# Patient Record
Sex: Male | Born: 1970 | Race: Black or African American | Hispanic: No | Marital: Single | Smoking: Never smoker
Health system: Southern US, Community
[De-identification: ages and names within clinical notes are randomized; demographics above are authoritative.]

## PROBLEM LIST (undated history)

## (undated) DIAGNOSIS — I10 Essential (primary) hypertension: Secondary | ICD-10-CM

---

## 2009-12-18 ENCOUNTER — Emergency Department (HOSPITAL_COMMUNITY): Admission: EM | Admit: 2009-12-18 | Discharge: 2009-12-18 | Payer: Self-pay | Admitting: Emergency Medicine

## 2010-05-23 LAB — URINE MICROSCOPIC-ADD ON

## 2010-05-23 LAB — COMPREHENSIVE METABOLIC PANEL
AST: 22 U/L (ref 0–37)
BUN: 9 mg/dL (ref 6–23)
CO2: 28 mEq/L (ref 19–32)
Calcium: 8.8 mg/dL (ref 8.4–10.5)
Chloride: 104 mEq/L (ref 96–112)
Creatinine, Ser: 1.12 mg/dL (ref 0.4–1.5)
GFR calc Af Amer: >60 mL/min (ref >60)
GFR calc non Af Amer: >60 mL/min (ref >60)
Glucose, Bld: 90 mg/dL (ref 70–99)
Total Bilirubin: 0.5 mg/dL (ref 0.3–1.2)

## 2010-05-23 LAB — LIPASE, BLOOD: Lipase: 26 U/L (ref 11–59)

## 2010-05-23 LAB — CBC
Hemoglobin: 14.5 g/dL (ref 13.0–17.0)
MCH: 28.8 pg (ref 26.0–34.0)
MCHC: 32.8 g/dL (ref 30.0–36.0)
MCV: 87.7 fL (ref 78.0–100.0)

## 2010-05-23 LAB — URINALYSIS, ROUTINE W REFLEX MICROSCOPIC
Ketones, ur: NEGATIVE mg/dL
Nitrite: NEGATIVE
Specific Gravity, Urine: 1.022 (ref 1.005–1.030)
Urobilinogen, UA: 0.2 mg/dL (ref 0.0–1.0)
pH: 6.5 (ref 5.0–8.0)

## 2010-05-23 LAB — DIFFERENTIAL
Basophils Absolute: 0 10*3/uL (ref 0.0–0.1)
Lymphocytes Relative: 38 % (ref 12–46)
Lymphs Abs: 2.2 10*3/uL (ref 0.7–4.0)
Neutro Abs: 3 10*3/uL (ref 1.7–7.7)
Neutrophils Relative %: 52 % (ref 43–77)

## 2011-07-25 ENCOUNTER — Emergency Department (HOSPITAL_COMMUNITY): Payer: Self-pay

## 2011-07-25 ENCOUNTER — Encounter (HOSPITAL_COMMUNITY): Payer: Self-pay | Admitting: *Deleted

## 2011-07-25 ENCOUNTER — Emergency Department (HOSPITAL_COMMUNITY)
Admission: EM | Admit: 2011-07-25 | Discharge: 2011-07-25 | Disposition: A | Discharge status: Home / Self Care | Payer: Self-pay | Attending: Emergency Medicine | Admitting: Emergency Medicine

## 2011-07-25 DIAGNOSIS — I459 Conduction disorder, unspecified: Secondary | ICD-10-CM | POA: Insufficient documentation

## 2011-07-25 DIAGNOSIS — R51 Headache: Secondary | ICD-10-CM | POA: Insufficient documentation

## 2011-07-25 DIAGNOSIS — R55 Syncope and collapse: Secondary | ICD-10-CM | POA: Insufficient documentation

## 2011-07-25 DIAGNOSIS — I10 Essential (primary) hypertension: Secondary | ICD-10-CM

## 2011-07-25 LAB — COMPREHENSIVE METABOLIC PANEL
ALT: 26 U/L (ref 0–53)
Alkaline Phosphatase: 76 U/L (ref 39–117)
BUN: 12 mg/dL (ref 6–23)
Chloride: 103 mEq/L (ref 96–112)
GFR calc Af Amer: 81 mL/min — ABNORMAL LOW (ref >90)
Glucose, Bld: 103 mg/dL — ABNORMAL HIGH (ref 70–99)
Potassium: 4 mEq/L (ref 3.5–5.1)
Sodium: 140 mEq/L (ref 135–145)
Total Bilirubin: 0.5 mg/dL (ref 0.3–1.2)
Total Protein: 8 g/dL (ref 6.0–8.3)

## 2011-07-25 LAB — CBC
HCT: 46.7 % (ref 39.0–52.0)
Hemoglobin: 15.4 g/dL (ref 13.0–17.0)
MCHC: 33 g/dL (ref 30.0–36.0)
RBC: 5.4 MIL/uL (ref 4.22–5.81)
WBC: 6.8 10*3/uL (ref 4.0–10.5)

## 2011-07-25 LAB — TROPONIN I: Troponin I: <0.3 ng/mL (ref <0.30)

## 2011-07-25 NOTE — ED Notes (Signed)
ZOX:[id number]<AV> Expected date:<BR> Expected time: 1:23 PM<BR> Means of arrival:<BR> Comments:<BR> M61 - 40yoM Syncope [place] bus stop

## 2011-07-25 NOTE — Discharge Instructions (Signed)
Rest. Drink plenty of fluids. Follow up with primary care doctor in the next few days for recheck - also have your blood pressure rechecked then, as it is high today. Return to ER if worse, symptoms recur, faint/dizzy, palpitations or persistent fast heart beat, chest pain, shortness of breath, fevers, other concern.     Near-Syncope Near-syncope is sudden weakness, dizziness, or feeling like you might pass out (faint). This may occur when getting up after sitting or while standing for a long period of time. Near-syncope can be caused by a drop in blood pressure. This is a common reaction, but it may occur to a greater degree in people taking medicines to control their blood pressure. Fainting often occurs when the blood pressure or pulse is too low to provide enough blood flow to the brain to keep you conscious. Fainting and near-syncope are not usually due to serious medical problems. However, certain people should be more cautious in the event of near-syncope, including elderly patients, patients with diabetes, and patients with a history of heart conditions (especially irregular rhythms).  CAUSES   Drop in blood pressure.   Physical pain.   Dehydration.   Heat exhaustion.   Emotional distress.   Low blood sugar.   Internal bleeding.   Heart and circulatory problems.   Infections.  SYMPTOMS   Dizziness.   Feeling sick to your stomach (nauseous).   Nearly fainting.   Body numbness.   Turning pale.   Tunnel vision.   Weakness.  HOME CARE INSTRUCTIONS   Lie down right away if you start feeling like you might faint. Breathe deeply and steadily. Wait until all the symptoms have passed. Most of these episodes last only a few minutes. You may feel tired for several hours.   Drink enough fluids to keep your urine clear or pale yellow.   If you are taking blood pressure or heart medicine, get up slowly, taking several minutes to sit and then stand. This can reduce dizziness  that is caused by a drop in blood pressure.  SEEK IMMEDIATE MEDICAL CARE IF:   You have a severe headache.   Unusual pain develops in the chest, abdomen, or back.   There is bleeding from the mouth or rectum, or you have black or tarry stool.   An irregular heartbeat or a very rapid pulse develops.   You have repeated fainting or seizure-like jerking during an episode.   You faint when sitting or lying down.   You develop confusion.   You have difficulty walking.   Severe weakness develops.   Vision problems develop.  MAKE SURE YOU:   Understand these instructions.   Will watch your condition.   Will get help right away if you are not doing well or get worse.  Document Released: 02/24/2005 Document Revised: 02/13/2011 Document Reviewed: 04/12/2010 [facility] Patient Information 2012 [place], [place].      Syncope You have had a fainting (syncopal) spell. A fainting episode is a sudden, short-lived loss of consciousness. It results in complete recovery. It occurs because there has been a temporary shortage of oxygen and/or sugar (glucose) to the brain. CAUSES   Blood pressure pills and other medications that may lower blood pressure below normal. Sudden changes in posture (sudden standing).   Over-medication. Take your medications as directed.   Standing too long. This can cause blood to pool in the legs.   Seizure disorders.   Low blood sugar (hypoglycemia) of diabetes. This more commonly causes coma.  Bearing down to go to the bathroom. This can cause your blood pressure to rise suddenly. Your body compensates by making the blood pressure too low when you stop bearing down.   Hardening of the arteries where the brain temporarily does not receive enough blood.   Irregular heart beat and circulatory problems.   Fear, emotional distress, injury, sight of blood, or illness.  Your caregiver will send you home if the syncope was from non-worrisome causes (benign).  Depending on your age and health, you may stay to be monitored and observed. If you return home, have someone stay with you if your caregiver feels that is desirable. It is very important to keep all follow-up referrals and appointments in order to properly manage this condition. This is a serious problem which can lead to serious illness and death if not carefully managed.  WARNING: Do not drive or operate machinery until your caregiver feels that it is safe for you to do so. SEEK IMMEDIATE MEDICAL CARE IF:   You have another fainting episode or faint while lying or sitting down. DO NOT DRIVE YOURSELF. Call 911 if no other help is available.   You have chest pain, are feeling sick to your stomach (nausea), vomiting or abdominal pain.   You have an irregular heartbeat or one that is very fast (pulse over 120 beats per minute).   You have a loss of feeling in some part of your body or lose movement in your arms or legs.   You have difficulty with speech, confusion, severe weakness, or visual problems.   You become sweaty and/or feel light headed.  Make sure you are rechecked as instructed. Document Released: 02/24/2005 Document Revised: 02/13/2011 Document Reviewed: 10/15/2006 [facility] Patient Information 2012 [place], [place].       Hypertension As your heart beats, it forces blood through your arteries. This force is your blood pressure. If the pressure is too high, it is called hypertension (HTN) or high blood pressure. HTN is dangerous because you may have it and not know it. High blood pressure may mean that your heart has to work harder to pump blood. Your arteries may be narrow or stiff. The extra work puts you at risk for heart disease, stroke, and other problems.  Blood pressure consists of two numbers, a higher number over a lower, 110/72, for example. It is stated as "110 over 72." The ideal is below 120 for the top number (systolic) and under 80 for the bottom (diastolic). Write  down your blood pressure today. You should pay close attention to your blood pressure if you have certain conditions such as:  Heart failure.   Prior heart attack.   Diabetes   Chronic kidney disease.   Prior stroke.   Multiple risk factors for heart disease.  To see if you have HTN, your blood pressure should be measured while you are seated with your arm held at the level of the heart. It should be measured at least twice. A one-time elevated blood pressure reading (especially in the Emergency Department) does not mean that you need treatment. There may be conditions in which the blood pressure is different between your right and left arms. It is important to see your caregiver soon for a recheck. Most people have essential hypertension which means that there is not a specific cause. This type of high blood pressure may be lowered by changing lifestyle factors such as:  Stress.   Smoking.   Lack of exercise.   Excessive  weight.   Drug/tobacco/alcohol use.   Eating less salt.  Most people do not have symptoms from high blood pressure until it has caused damage to the body. Effective treatment can often prevent, delay or reduce that damage. TREATMENT  When a cause has been identified, treatment for high blood pressure is directed at the cause. There are a large number of medications to treat HTN. These fall into several categories, and your caregiver will help you select the medicines that are best for you. Medications may have side effects. You should review side effects with your caregiver. If your blood pressure stays high after you have made lifestyle changes or started on medicines,   Your medication(s) may need to be changed.   Other problems may need to be addressed.   Be certain you understand your prescriptions, and know how and when to take your medicine.   Be sure to follow up with your caregiver within the time frame advised (usually within two weeks) to have your  blood pressure rechecked and to review your medications.   If you are taking more than one medicine to lower your blood pressure, make sure you know how and at what times they should be taken. Taking two medicines at the same time can result in blood pressure that is too low.  SEEK IMMEDIATE MEDICAL CARE IF:  You develop a severe headache, blurred or changing vision, or confusion.   You have unusual weakness or numbness, or a faint feeling.   You have severe chest or abdominal pain, vomiting, or breathing problems.  MAKE SURE YOU:   Understand these instructions.   Will watch your condition.   Will get help right away if you are not doing well or get worse.  Document Released: 02/24/2005 Document Revised: 02/13/2011 Document Reviewed: 10/15/2007 [facility] Patient Information 2012 [place], [place].         RESOURCE GUIDE  Dental Problems  Patients with Medicaid: [facility] [phone]                                           [phone] [address] Phone:  [phone]                                                  Phone:  [phone]  If unable to pay or uninsured, contact:  [facility] or [facility]. to become qualified for the adult dental clinic.  Chronic Pain Problems Contact [facility]  [phone] Patients need to be referred by their primary care doctor.  Insufficient Money for Medicine Contact [employer]:  call "211" or [facility] [phone].  No Primary Care Doctor Call [facility]  2260551002 Other agencies that provide inexpensive medical care    [facility]  [phone]    [facility]  [phone]    [facility]  [phone]    [facility]  [phone]    [facility]  [phone]    [facility]  [phone]  Psychological Services [facility]  [phone] [facility]  [phone] [facility]  Mental Health   [phone] (emergency services [phone])  Substance Abuse Resources [facility]  [phone] [facility] [phone] [facility] [phone] Floydene Flock [phone] Residential & Outpatient Substance Abuse Program  [phone]  Abuse/Neglect [facility] Child Abuse Hotline (936)615-1412 [facility] Child Abuse Hotline [phone] (After Hours)  Emergency Shelter [facility] 3183782928  Maternity [facility] [place] [facility] [phone] [facility] [phone]  MRSA Hotline #:   812-349-3269    [facility] Emergency Dept Resources  [facility]     [employer]                          [facility] Dept. [address] [place]                       [address]      [address]  Blondell Reveal Phone:  [phone]                                   Phone:  [phone]                 Phone:  [phone]  [facility] Phone:  [phone]  [facility] Child Abuse Hotline (918)504-4617 [phone] (After Hours)

## 2011-07-25 NOTE — ED Provider Notes (Addendum)
History     CSN: [id number]  Arrival date & time 07/25/11  1327   First MD Initiated Contact with Patient 07/25/11 1458      Chief Complaint  Patient presents with  . Near Syncope    (Consider location/radiation/quality/duration/timing/severity/associated sxs/prior treatment) The history is provided by the patient.  pt c/o feeling very faint, almost passing out just prior to arrival. States had walked 'in heat' to bus stop. hadnt eaten anything today. Denies any chest pain or tightness. No palpitations or irregular heart beat. No hx dysrhythmia. Denies hx cad. No fam hx premature cad. States felt find when awoke today. Denies loc or injury. States now has gradual onset frontal headache, c/w prior headaches. No headache earlier or associated with feeling faint earlier. No neck pain or stiffness. No change in speech or vision. No numbness/weakness. Denies problems w balance or coordination. Pt denies cough, sinus pain/drainage or uri c/o. No sob. No abd pain. No nvd. No melena or rectal bleeding. Denies hx anemia. No gu c/o. No fever or chills. Denies recent change in meds or new meds. No substance abuse.   History reviewed. No pertinent past medical history.  History reviewed. No pertinent past surgical history.  History reviewed. No pertinent family history.  History  Substance Use Topics  . Smoking status: Not on file  . Smokeless tobacco: Not on file  . Alcohol Use: Not on file      Review of Systems  Constitutional: Negative for fever and chills.  HENT: Negative for sore throat and neck pain.   Eyes: Negative for pain and visual disturbance.  Respiratory: Negative for cough and shortness of breath.   Cardiovascular: Negative for chest pain, palpitations and leg swelling.  Gastrointestinal: Negative for vomiting, abdominal pain, diarrhea and blood in stool.  Genitourinary: Negative for dysuria and flank pain.  Musculoskeletal: Negative for back pain.  Skin: Negative for  rash.  Neurological: Negative for weakness and numbness.  Hematological: Does not bruise/bleed easily.  Psychiatric/Behavioral: Negative for confusion.    Allergies  Review of patient's allergies indicates no known allergies.  Home Medications   Current Outpatient Rx  Name Route Sig Dispense Refill  . NAPROXEN SODIUM 220 MG PO TABS Oral Take 220 mg by mouth 2 (two) times daily as needed. For pain.      BP 157/97  Pulse 93  Temp(Src) 97.8 F (36.6 C) (Oral)  Resp 20  SpO2 99%  Physical Exam  Nursing note and vitals reviewed. Constitutional: He is oriented to person, place, and time. He appears well-developed and well-nourished. No distress.  HENT:  Head: Atraumatic.  Nose: Nose normal.  Mouth/Throat: Oropharynx is clear and moist.  Eyes: Conjunctivae and EOM are normal. Pupils are equal, round, and reactive to light. No scleral icterus.  Neck: Normal range of motion. Neck supple. No tracheal deviation present.       No stiffness or rigidity.   Cardiovascular: Normal rate, regular rhythm, normal heart sounds and intact distal pulses.  Exam reveals no gallop and no friction rub.   No murmur heard. Pulmonary/Chest: Effort normal and breath sounds normal. No accessory muscle usage. No respiratory distress.  Abdominal: Soft. Bowel sounds are normal. He exhibits no distension and no mass. There is no tenderness. There is no rebound and no guarding.  Genitourinary:       No cva tenderness  Musculoskeletal: Normal range of motion. He exhibits no edema and no tenderness.  Neurological: He is alert and oriented to person, place, and  time. No cranial nerve deficit.       Motor intact bil. Steady gait.   Skin: Skin is warm and dry. He is not diaphoretic.  Psychiatric: He has a normal mood and affect.    ED Course  Procedures (including critical care time)   Labs Reviewed  CBC  COMPREHENSIVE METABOLIC PANEL  TROPONIN I   Results for orders placed during the hospital encounter  of 07/25/11  CBC      Component Value Range   WBC 6.8  4.0 - 10.5 (K/uL)   RBC 5.40  4.22 - 5.81 (MIL/uL)   Hemoglobin 15.4  13.0 - 17.0 (g/dL)   HCT 45.4  09.8 - 11.9 (%)   MCV 86.5  78.0 - 100.0 (fL)   MCH 28.5  26.0 - 34.0 (pg)   MCHC 33.0  30.0 - 36.0 (g/dL)   RDW 14.7  82.9 - 56.2 (%)   Platelets 288  150 - 400 (K/uL)  COMPREHENSIVE METABOLIC PANEL      Component Value Range   Sodium 140  135 - 145 (mEq/L)   Potassium 4.0  3.5 - 5.1 (mEq/L)   Chloride 103  96 - 112 (mEq/L)   CO2 28  19 - 32 (mEq/L)   Glucose, Bld 103 (*) 70 - 99 (mg/dL)   BUN 12  6 - 23 (mg/dL)   Creatinine, Ser 1.30  0.50 - 1.35 (mg/dL)   Calcium 8.8  8.4 - 86.5 (mg/dL)   Total Protein 8.0  6.0 - 8.3 (g/dL)   Albumin 4.2  3.5 - 5.2 (g/dL)   AST 34  0 - 37 (U/L)   ALT 26  0 - 53 (U/L)   Alkaline Phosphatase 76  39 - 117 (U/L)   Total Bilirubin 0.5  0.3 - 1.2 (mg/dL)   GFR calc non Af Amer 70 (*) >90 (mL/min)   GFR calc Af Amer 81 (*) >90 (mL/min)  TROPONIN I      Component Value Range   Troponin I <0.30  <0.30 (ng/mL)   Ct Head Wo Contrast  07/25/2011  *RADIOLOGY REPORT*  Clinical Data: History of syncope.  Headache.  CT HEAD WITHOUT CONTRAST  Technique:  Contiguous axial images were obtained from the base of the skull through the vertex without contrast.  Comparison: No priors.  Findings: No acute intracranial abnormalities.  Specifically, no definitive signs to suggest acute/subacute cerebral ischemia, no hemorrhage, mass, mass effect, hydrocephalus or abnormal intra or extra-axial fluid collections.  No acute displaced skull fractures are identified.  Visualized paranasal sinuses and mastoids are well pneumatized.  IMPRESSION: 1.  No acute intracranial abnormalities. 2.  The appearance of the brain is normal.  Original Report Authenticated By: Florencia Reasons, M.D.       MDM  Monitor. Ecg. Labs.   Reviewed nursing notes and prior charts for additional history.      Date: 07/25/2011  Rate:  94  Rhythm: normal sinus rhythm  QRS Axis: normal  Intervals: normal  ST/T Wave abnormalities: normal  Conduction Disutrbances:nonspecific intraventricular conduction delay  Narrative Interpretation:   Old EKG Reviewed: none available   Recheck nsr on monitor. No dysrhythmia noted. Tylenol po.   pt given meal tray, eating and drinking.  Recheck no distress. No c/o. No faintness or dizziness. Discussed need follow up re bp elevation and near syncope - close pcp follow up.   Recheck hr 92, nsr. rr 16. Denies faintness, no cp, no sob, no fever or chills. Pt states feels at  baseline.    Suzi Roots, MD 07/25/11 1700  Suzi Roots, MD 07/25/11 [phone]

## 2011-07-25 NOTE — ED Notes (Signed)
Patient is alert and oriented x3.  Patient was seen during a near syncopal episode while walking to the bus stop EMS was called.  Blood sugar 103.  V/S 150/114  120.  Patient states that he has a headache with dizziness.  Denies shortness of breath, nausea or vomiting.

## 2013-05-17 ENCOUNTER — Emergency Department (HOSPITAL_COMMUNITY)
Admission: EM | Admit: 2013-05-17 | Discharge: 2013-05-17 | Disposition: A | Discharge status: Home / Self Care | Payer: BC Managed Care – PPO | Source: Home / Self Care | Attending: Family Medicine | Admitting: Family Medicine

## 2013-05-17 ENCOUNTER — Encounter (HOSPITAL_COMMUNITY): Payer: Self-pay | Admitting: Emergency Medicine

## 2013-05-17 ENCOUNTER — Emergency Department (INDEPENDENT_AMBULATORY_CARE_PROVIDER_SITE_OTHER): Payer: BC Managed Care – PPO

## 2013-05-17 DIAGNOSIS — J069 Acute upper respiratory infection, unspecified: Secondary | ICD-10-CM

## 2013-05-17 LAB — POCT URINALYSIS DIP (DEVICE)
BILIRUBIN URINE: NEGATIVE
GLUCOSE, UA: NEGATIVE mg/dL
Ketones, ur: 15 mg/dL — AB
Leukocytes, UA: NEGATIVE
NITRITE: NEGATIVE
Protein, ur: >=300 mg/dL — AB
Specific Gravity, Urine: >=1.03 (ref 1.005–1.030)
Urobilinogen, UA: 0.2 mg/dL (ref 0.0–1.0)
pH: 5.5 (ref 5.0–8.0)

## 2013-05-17 MED ORDER — GUAIFENESIN-CODEINE 100-10 MG/5ML PO SYRP: 10.0000 mL | ORAL_SOLUTION | Freq: Four times a day (QID) | ORAL | Status: DC | PRN

## 2013-05-17 MED ORDER — IPRATROPIUM BROMIDE 0.06 % NA SOLN: 2.0000 | Freq: Four times a day (QID) | NASAL | Status: DC

## 2013-05-17 MED ORDER — AZITHROMYCIN 250 MG PO TABS: ORAL_TABLET | ORAL | Status: DC

## 2013-05-17 NOTE — Discharge Instructions (Signed)
Drink plenty of fluids as discussed, use medicine as prescribed, and mucinex or delsym for cough. Return or see your doctor if further problems

## 2013-05-17 NOTE — ED Provider Notes (Signed)
CSN: [id number]     Arrival date & time 05/17/13  1313 History   None    Chief Complaint  Patient presents with  . Back Pain   (Consider location/radiation/quality/duration/timing/severity/associated sxs/prior Treatment) Patient is a 43 y.o. male presenting with URI. The history is provided by the patient.  URI Presenting symptoms: congestion, cough, fever and rhinorrhea   Severity:  Moderate Onset quality:  Sudden Duration:  5 days Progression:  Unchanged Chronicity:  New Relieved by:  None tried Ineffective treatments:  None tried Associated symptoms: no wheezing   Associated symptoms comment:  Back pain from coughing   History reviewed. No pertinent past medical history. History reviewed. No pertinent past surgical history. History reviewed. No pertinent family history. History  Substance Use Topics  . Smoking status: Not on file  . Smokeless tobacco: Not on file  . Alcohol Use: No    Review of Systems  Constitutional: Positive for fever.  HENT: Positive for congestion and rhinorrhea.   Respiratory: Positive for cough. Negative for shortness of breath and wheezing.   Musculoskeletal: Positive for back pain.    Allergies  Review of patient's allergies indicates no known allergies.  Home Medications   Current Outpatient Rx  Name  Route  Sig  Dispense  Refill  . azithromycin (ZITHROMAX Z-PAK) 250 MG tablet      Take as directed on pack   6 each   0   . guaiFENesin-codeine (ROBITUSSIN AC) 100-10 MG/5ML syrup   Oral   Take 10 mLs by mouth 4 (four) times daily as needed for cough.   180 mL   0   . ipratropium (ATROVENT) 0.06 % nasal spray   Each Nare   Place 2 sprays into both nostrils 4 (four) times daily.   15 mL   1   . naproxen sodium (ANAPROX) 220 MG tablet   Oral   Take 220 mg by mouth 2 (two) times daily as needed. For pain.          BP 144/92  Pulse 101  Temp(Src) 98.9 F (37.2 C) (Oral)  Resp 16  SpO2 99% Physical Exam  Nursing note  and vitals reviewed. Constitutional: He is oriented to person, place, and time. He appears well-developed and well-nourished. No distress.  Neck: Normal range of motion. Neck supple.  Pulmonary/Chest: Effort normal and breath sounds normal. He has no wheezes. He has no rales. He exhibits tenderness.  Lymphadenopathy:    He has no cervical adenopathy.  Neurological: He is alert and oriented to person, place, and time.  Skin: Skin is warm and dry.    ED Course  Procedures (including critical care time) Labs Review Labs Reviewed  POCT URINALYSIS DIP (DEVICE) - Abnormal; Notable for the following:    Ketones, ur 15 (*)    Hgb urine dipstick TRACE (*)    Protein, ur >=300 (*)    All other components within normal limits   Imaging Review Dg Chest 2 View  05/17/2013   CLINICAL DATA Cough, fever  EXAM CHEST  2 VIEW  COMPARISON None.  FINDINGS The heart size and mediastinal contours are within normal limits. Both lungs are clear. The visualized skeletal structures are unremarkable.  IMPRESSION No active cardiopulmonary disease.  SIGNATURE  Electronically Signed   By: Abelardo Diesel M.D.   On: 05/17/2013 16:04   X-rays reviewed and report per radiologist.   MDM   1. URI (upper respiratory infection)        Billy Fischer,  MD 05/17/13 1712

## 2013-05-17 NOTE — ED Notes (Signed)
Pt  Reports   Pain in  Mid  Back  For  About  4  Days  Pt  Recovering  from  resp  Infection and  States  Has  Been  Coughing  Frequently   He  denys  Any  Urinary  Symptoms         He  denys  Any  Injury       He  Ambulated  To  Room  With a  Steady  Fluid  Gait

## 2013-05-30 ENCOUNTER — Encounter (HOSPITAL_COMMUNITY): Payer: Self-pay | Admitting: Emergency Medicine

## 2013-05-30 ENCOUNTER — Emergency Department (HOSPITAL_COMMUNITY)
Admission: EM | Admit: 2013-05-30 | Discharge: 2013-05-30 | Disposition: A | Discharge status: Home / Self Care | Payer: BC Managed Care – PPO | Source: Home / Self Care | Attending: Emergency Medicine | Admitting: Emergency Medicine

## 2013-05-30 DIAGNOSIS — K047 Periapical abscess without sinus: Secondary | ICD-10-CM

## 2013-05-30 DIAGNOSIS — K029 Dental caries, unspecified: Secondary | ICD-10-CM

## 2013-05-30 MED ORDER — AMOXICILLIN 875 MG PO TABS: 875.0000 mg | ORAL_TABLET | Freq: Two times a day (BID) | ORAL | Status: DC

## 2013-05-30 MED ORDER — HYDROCODONE-ACETAMINOPHEN 5-325 MG PO TABS: 1.0000 | ORAL_TABLET | Freq: Four times a day (QID) | ORAL | Status: DC | PRN

## 2013-05-30 NOTE — ED Notes (Signed)
43 yr old male is here today with complaints of tooth pain. He states the pain is Right lower part of his mouth for the past four days. He has been taking OTC Aleve for the pain and rinsing his mouth out with warm salt water. He states he has a dentist appt on Thursday. Denies: fever;SOB; Chest pain

## 2013-05-30 NOTE — ED Provider Notes (Signed)
CSN: [id number]     Arrival date & time 05/30/13  1503 History   First MD Initiated Contact with Patient 05/30/13 1704     No chief complaint on file.  (Consider location/radiation/quality/duration/timing/severity/associated sxs/prior Treatment) HPI Comments: 43 year old male with no significant past medical history presents complaining of a right lower jaw dental abscess for the past few days. He has redness, pain, and some swelling in his right lower jaw. Overnight, he says this started draining on its own. He has been taking his greater than the recommended amount of Aleve, as well as Tylenol, with only minimal relief of his symptoms. He denies any other associated symptoms or any systemic symptoms. He has a Pharmacist, community but they would not be of to see him until Thursday so she decided to come in here to see what can be done.   History reviewed. No pertinent past medical history. History reviewed. No pertinent past surgical history. No family history on file. History  Substance Use Topics  . Smoking status: Never Smoker   . Smokeless tobacco: Not on file  . Alcohol Use: No    Review of Systems  HENT: Positive for dental problem and facial swelling.   All other systems reviewed and are negative.    Allergies  Review of patient's allergies indicates no known allergies.  Home Medications   Current Outpatient Rx  Name  Route  Sig  Dispense  Refill  . naproxen sodium (ANAPROX) 220 MG tablet   Oral   Take 220 mg by mouth 2 (two) times daily as needed. For pain.         Marland Kitchen amoxicillin (AMOXIL) 875 MG tablet   Oral   Take 1 tablet (875 mg total) by mouth 2 (two) times daily.   30 tablet   1   . azithromycin (ZITHROMAX Z-PAK) 250 MG tablet      Take as directed on pack   6 each   0   . guaiFENesin-codeine (ROBITUSSIN AC) 100-10 MG/5ML syrup   Oral   Take 10 mLs by mouth 4 (four) times daily as needed for cough.   180 mL   0   . HYDROcodone-acetaminophen (NORCO) 5-325 MG  per tablet   Oral   Take 1 tablet by mouth every 6 (six) hours as needed for moderate pain.   15 tablet   0   . ipratropium (ATROVENT) 0.06 % nasal spray   Each Nare   Place 2 sprays into both nostrils 4 (four) times daily.   15 mL   1    BP 182/101  Pulse 93  Temp(Src) 98.4 F (36.9 C) (Oral)  Resp 16  SpO2 100% Physical Exam  Nursing note and vitals reviewed. Constitutional: He is oriented to person, place, and time. He appears well-developed and well-nourished. No distress.  HENT:  Head: Normocephalic.  Mouth/Throat: Uvula is midline and oropharynx is clear and moist. No trismus in the jaw. Abnormal dentition. Dental abscesses and dental caries present.    Pulmonary/Chest: Effort normal. No respiratory distress.  Lymphadenopathy:       Head (right side): No submental, no submandibular and no tonsillar adenopathy present.       Head (left side): No submental, no submandibular and no tonsillar adenopathy present.    He has no cervical adenopathy.  Neurological: He is alert and oriented to person, place, and time. Coordination normal.  Skin: Skin is warm and dry. No rash noted. He is not diaphoretic.  Psychiatric: He has a normal mood  and affect. Judgment normal.    ED Course  Procedures (including critical care time) Labs Review Labs Reviewed - No data to display Imaging Review No results found.   MDM   1. Dental abscess   2. Dental caries    Treating with amoxicillin, he will followup with his dentist. Also Norco for pain and continue Aleve at only 2-3 over-the-counter tablets every 12 hours    Meds ordered this encounter  Medications  . amoxicillin (AMOXIL) 875 MG tablet    Sig: Take 1 tablet (875 mg total) by mouth 2 (two) times daily.    Dispense:  30 tablet    Refill:  1    Order Specific Question:  Supervising Provider    Answer:  Billy Fischer [phone]  . HYDROcodone-acetaminophen (NORCO) 5-325 MG per tablet    Sig: Take 1 tablet by mouth every 6  (six) hours as needed for moderate pain.    Dispense:  15 tablet    Refill:  0    Order Specific Question:  Supervising Provider    Answer:  Ihor Gully D [place]to, PA-C 05/30/13 [phone]

## 2013-05-30 NOTE — Discharge Instructions (Signed)
Dental Abscess A dental abscess is a collection of infected fluid (pus) from a bacterial infection in the inner part of the tooth (pulp). It usually occurs at the end of the tooth's root.  CAUSES   Severe tooth decay.  Trauma to the tooth that allows bacteria to enter into the pulp, such as a broken or chipped tooth. SYMPTOMS   Severe pain in and around the infected tooth.  Swelling and redness around the abscessed tooth or in the mouth or face.  Tenderness.  Pus drainage.  Bad breath.  Bitter taste in the mouth.  Difficulty swallowing.  Difficulty opening the mouth.  Nausea.  Vomiting.  Chills.  Swollen neck glands. DIAGNOSIS   A medical and dental history will be taken.  An examination will be performed by tapping on the abscessed tooth.  X-rays may be taken of the tooth to identify the abscess. TREATMENT The goal of treatment is to eliminate the infection. You may be prescribed antibiotic medicine to stop the infection from spreading. A root canal may be performed to save the tooth. If the tooth cannot be saved, it may be pulled (extracted) and the abscess may be drained.  HOME CARE INSTRUCTIONS  Only take over-the-counter or prescription medicines for pain, fever, or discomfort as directed by your caregiver.  Rinse your mouth (gargle) often with salt water ( tsp salt in 8 oz [250 ml] of warm water) to relieve pain or swelling.  Do not drive after taking pain medicine (narcotics).  Do not apply heat to the outside of your face.  Return to your dentist for further treatment as directed. SEEK MEDICAL CARE IF:  Your pain is not helped by medicine.  Your pain is getting worse instead of better. SEEK IMMEDIATE MEDICAL CARE IF:  You have a fever or persistent symptoms for more than 2 3 days.  You have a fever and your symptoms suddenly get worse.  You have chills or a very bad headache.  You have problems breathing or swallowing.  You have trouble  opening your mouth.  You have swelling in the neck or around the eye. Document Released: 02/24/2005 Document Revised: 11/19/2011 Document Reviewed: 06/04/2010 [facility] Patient Information 2014 [place], [place].  Dental Caries  Dental caries (also called tooth decay) is the most common oral disease. It can occur at any age, but is more common in children and young adults.  HOW DENTAL CARIES DEVELOPS  The process of decay begins when bacteria and foods (particularly sugars and starches) combine in your mouth to produce plaque. Plaque is a substance that sticks to the hard, outer surface of a tooth (enamel). The bacteria in plaque produce acids that attack enamel. These acids may also attack the root surface of a tooth (cementum) if it is exposed. Repeated attacks dissolve these surfaces and create holes in the tooth (cavities). If left untreated, the acids destroy the other layers of the tooth.  RISK FACTORS  Frequent sipping of sugary beverages.   Frequent snacking on sugary and starchy foods, especially those that easily get stuck in the teeth.   Poor oral hygiene.   Dry mouth.   Substance abuse such as methamphetamine abuse.   Broken or poor-fitting dental restorations.   Eating disorders.   Gastroesophageal reflux disease (GERD).   Certain radiation treatments to the head and neck. SYMPTOMS In the early stages of dental caries, symptoms are seldom present. Sometimes white, chalky areas may be seen on the enamel or other tooth layers. In later stages,  symptoms may include:  Pits and holes on the enamel.  Toothache after sweet, hot, or cold foods or drinks are consumed.  Pain around the tooth.  Swelling around the tooth. DIAGNOSIS  Most of the time, dental caries is detected during a regular dental checkup. A diagnosis is made after a thorough medical and dental history is taken and the surfaces of your teeth are checked for signs of dental caries. Sometimes special  instruments, such as lasers, are used to check for dental caries. Dental X-ray exams may be taken so that areas not visible to the eye (such as between the contact areas of the teeth) can be checked for cavities.  TREATMENT  If dental caries is in its early stages, it may be reversed with a fluoride treatment or an application of a remineralizing agent at the dental office. Thorough brushing and flossing at home is needed to aid these treatments. If it is in its later stages, treatment depends on the location and extent of tooth destruction:   If a small area of the tooth has been destroyed, the destroyed area will be removed and cavities will be filled with a material such as gold, silver amalgam, or composite resin.   If a large area of the tooth has been destroyed, the destroyed area will be removed and a cap (crown) will be fitted over the remaining tooth structure.   If the center part of the tooth (pulp) is affected, a procedure called a root canal will be needed before a filling or crown can be placed.   If most of the tooth has been destroyed, the tooth may need to be pulled (extracted). HOME CARE INSTRUCTIONS You can prevent, stop, or reverse dental caries at home by practicing good oral hygiene. Good oral hygiene includes:  Thoroughly cleaning your teeth at least twice a day with a toothbrush and dental floss.   Using a fluoride toothpaste. A fluoride mouth rinse may also be used if recommended by your dentist or health care provider.   Restricting the amount of sugary and starchy foods and sugary liquids you consume.   Avoiding frequent snacking on these foods and sipping of these liquids.   Keeping regular visits with a dentist for checkups and cleanings. PREVENTION   Practice good oral hygiene.  Consider a dental sealant. A dental sealant is a coating material that is applied by your dentist to the pits and grooves of teeth. The sealant prevents food from being trapped  in them. It may protect the teeth for several years.  Ask about fluoride supplements if you live in a community without fluorinated water or with water that has a low fluoride content. Use fluoride supplements as directed by your dentist or health care provider.  Allow fluoride varnish applications to teeth if directed by your dentist or health care provider. Document Released: 11/16/2001 Document Revised: 10/27/2012 Document Reviewed: 02/27/2012 [employer] Patient Information 2014 [place].  Dental Care and Dentist Visits Dental care supports good overall health. Regular dental visits can also help you avoid dental pain, bleeding, infection, and other more serious health problems in the future. It is important to keep the mouth healthy because diseases in the teeth, gums, and other oral tissues can spread to other areas of the body. Some problems, such as diabetes, heart disease, and pre-term labor have been associated with poor oral health.  See your dentist every 6 months. If you experience emergency problems such as a toothache or broken tooth, go to the  dentist right away. If you see your dentist regularly, you may catch problems early. It is easier to be treated for problems in the early stages.  WHAT TO EXPECT AT A DENTIST VISIT  Your dentist will look for many common oral health problems and recommend proper treatment. At your regular dental visit, you can expect:  Gentle cleaning of the teeth and gums. This includes scraping and polishing. This helps to remove the sticky substance around the teeth and gums (plaque). Plaque forms in the mouth shortly after eating. Over time, plaque hardens on the teeth as tartar. If tartar is not removed regularly, it can cause problems. Cleaning also helps remove stains.  Periodic X-rays. These pictures of the teeth and supporting bone will help your dentist assess the health of your teeth.  Periodic fluoride treatments. Fluoride is a natural mineral  shown to help strengthen teeth. Fluoride treatmentinvolves applying a fluoride gel or varnish to the teeth. It is most commonly done in children.  Examination of the mouth, tongue, jaws, teeth, and gums to look for any oral health problems, such as:  Cavities (dental caries). This is decay on the tooth caused by plaque, sugar, and acid in the mouth. It is best to catch a cavity when it is small.  Inflammation of the gums caused by plaque buildup (gingivitis).  Problems with the mouth or malformed or misaligned teeth.  Oral cancer or other diseases of the soft tissues or jaws. KEEP YOUR TEETH AND GUMS HEALTHY For healthy teeth and gums, follow these general guidelines as well as your dentist's specific advice:  Have your teeth professionally cleaned at the dentist every 6 months.  Brush twice daily with a fluoride toothpaste.  Floss your teeth daily.  Ask your dentist if you need fluoride supplements, treatments, or fluoride toothpaste.  Eat a healthy diet. Reduce foods and drinks with added sugar.  Avoid smoking. TREATMENT FOR ORAL HEALTH PROBLEMS If you have oral health problems, treatment varies depending on the conditions present in your teeth and gums.  Your caregiver will most likely recommend good oral hygiene at each visit.  For cavities, gingivitis, or other oral health disease, your caregiver will perform a procedure to treat the problem. This is typically done at a separate appointment. Sometimes your caregiver will refer you to another dental specialist for specific tooth problems or for surgery. SEEK IMMEDIATE DENTAL CARE IF:  You have pain, bleeding, or soreness in the gum, tooth, jaw, or mouth area.  A permanent tooth becomes loose or separated from the gum socket.  You experience a blow or injury to the mouth or jaw area. Document Released: 11/06/2010 Document Revised: 05/19/2011 Document Reviewed: 11/06/2010 [facility] Patient Information 2014 [place],  [place].

## 2013-05-31 NOTE — ED Provider Notes (Signed)
Medical screening examination/treatment/procedure(s) were performed by non-physician practitioner and as supervising physician I was immediately available for consultation/collaboration.  Philipp Deputy, M.D.  Harden Mo, MD 05/31/13 [phone]

## 2015-06-23 IMAGING — CR DG CHEST 2V
2 series · 2 of 2 positions shown · non-contrast
Comparison: none

[view not recorded (1 of 2)]
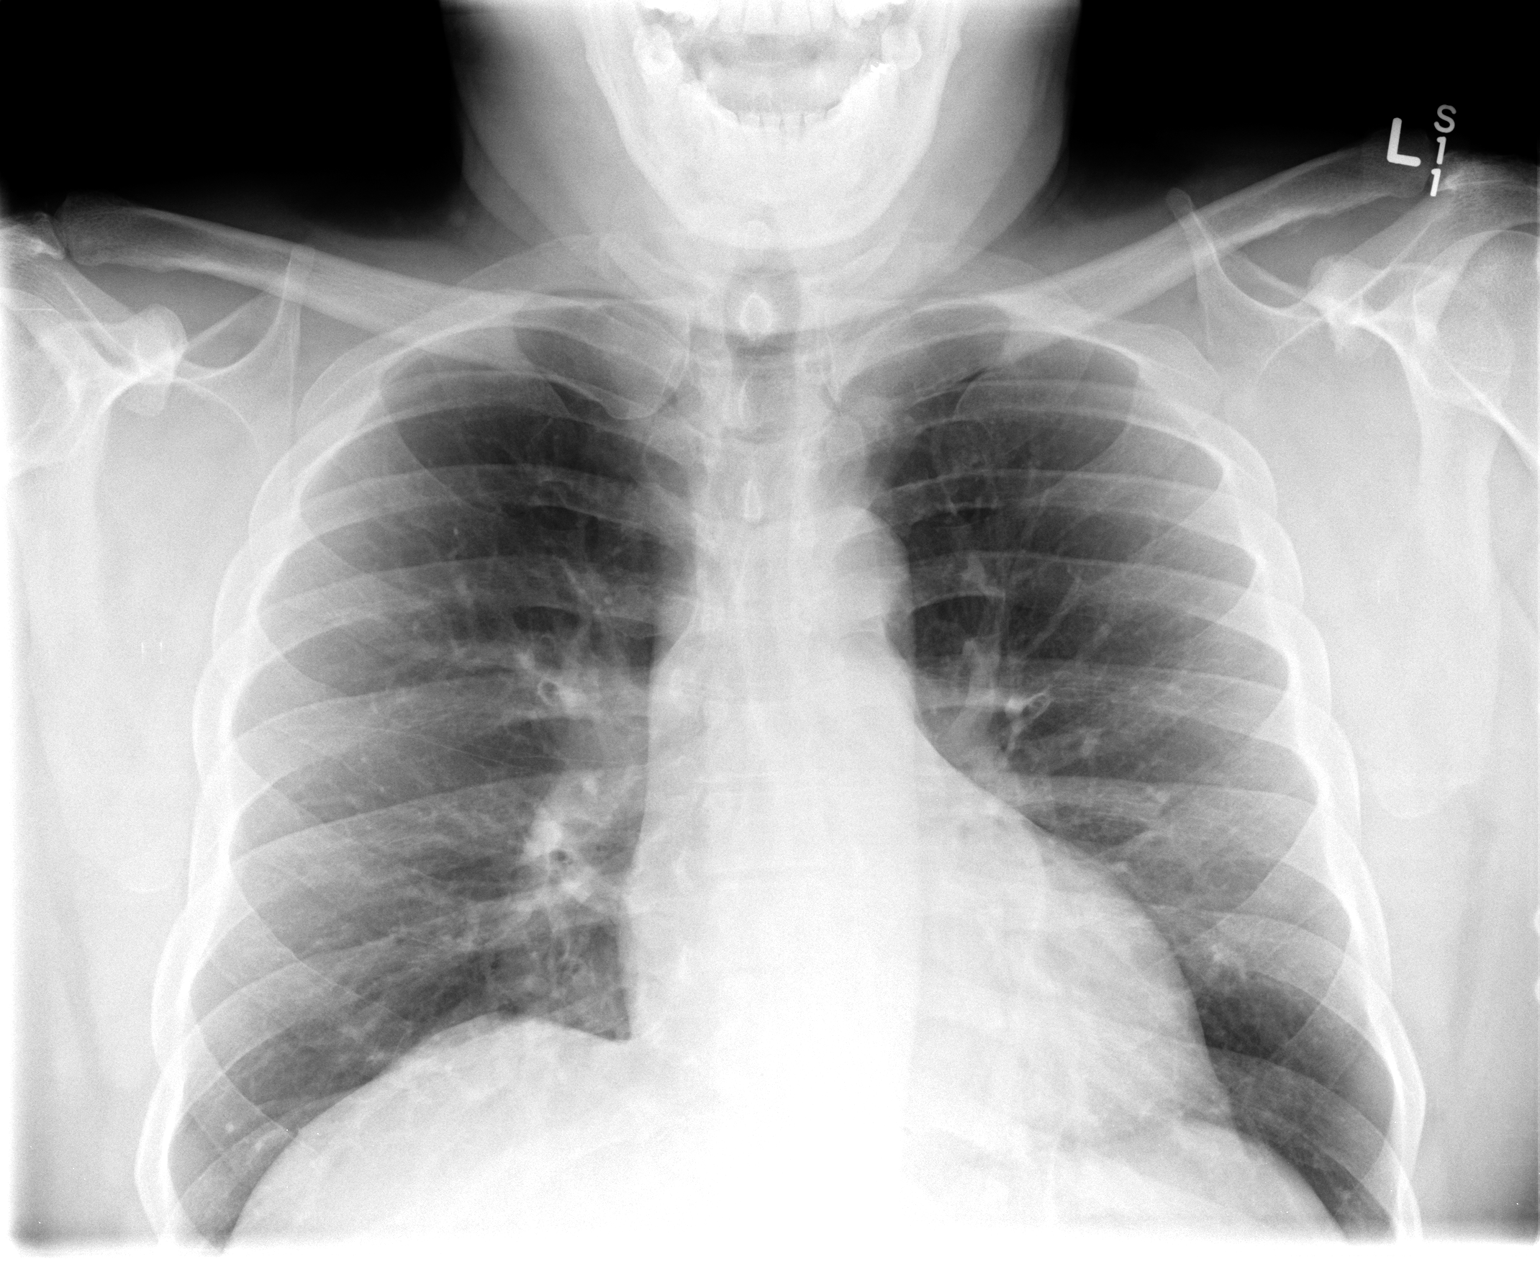

[view not recorded (2 of 2)]
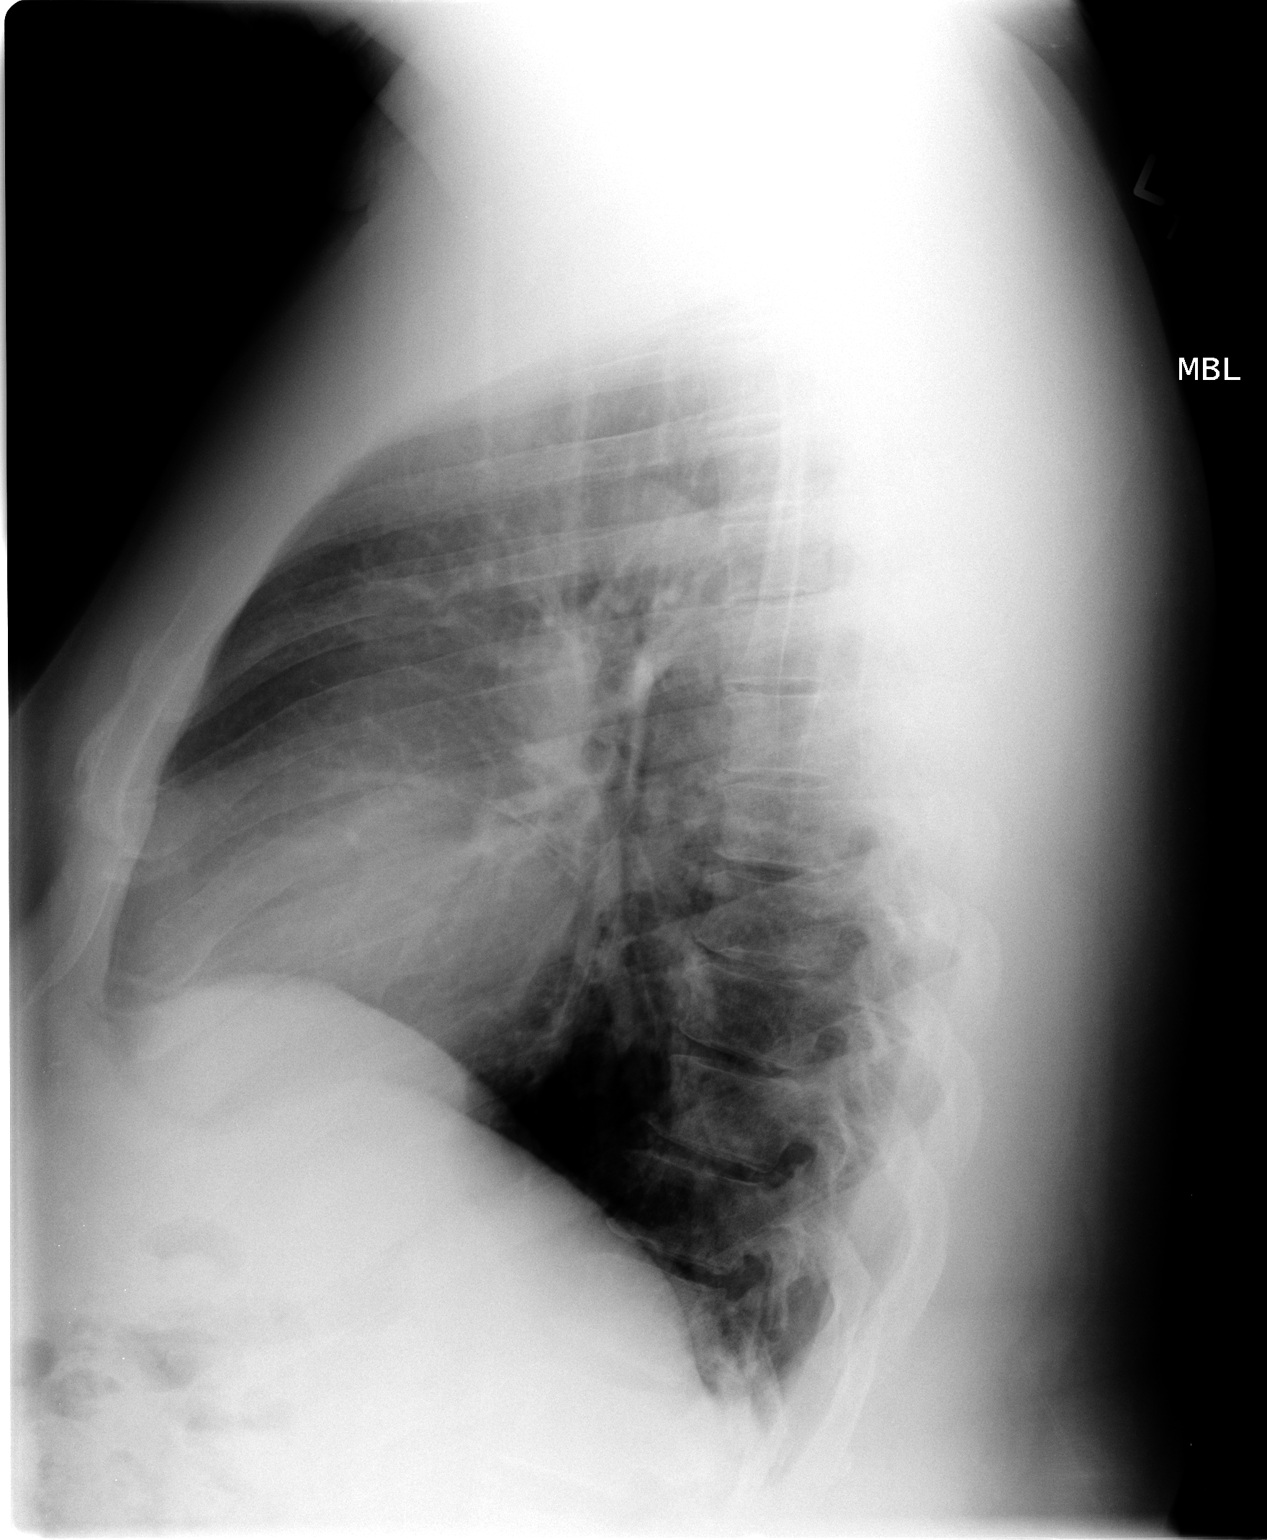

[2 of 2 positions shown; findings below may reference images not displayed]

CLINICAL DATA
Cough, fever

EXAM
CHEST  2 VIEW

COMPARISON
None.

FINDINGS
The heart size and mediastinal contours are within normal limits.
Both lungs are clear. The visualized skeletal structures are
unremarkable.

IMPRESSION
No active cardiopulmonary disease.

SIGNATURE

## 2017-07-30 ENCOUNTER — Emergency Department (HOSPITAL_COMMUNITY)
Admission: EM | Admit: 2017-07-30 | Discharge: 2017-07-30 | Disposition: A | Discharge status: Home / Self Care | Payer: Self-pay | Attending: Emergency Medicine | Admitting: Emergency Medicine

## 2017-07-30 ENCOUNTER — Emergency Department (HOSPITAL_COMMUNITY): Payer: Self-pay

## 2017-07-30 ENCOUNTER — Other Ambulatory Visit: Payer: Self-pay

## 2017-07-30 ENCOUNTER — Encounter (HOSPITAL_COMMUNITY): Payer: Self-pay | Admitting: Emergency Medicine

## 2017-07-30 DIAGNOSIS — R519 Headache, unspecified: Secondary | ICD-10-CM

## 2017-07-30 DIAGNOSIS — R51 Headache: Secondary | ICD-10-CM | POA: Insufficient documentation

## 2017-07-30 DIAGNOSIS — Z79899 Other long term (current) drug therapy: Secondary | ICD-10-CM | POA: Insufficient documentation

## 2017-07-30 DIAGNOSIS — R55 Syncope and collapse: Secondary | ICD-10-CM | POA: Insufficient documentation

## 2017-07-30 DIAGNOSIS — I10 Essential (primary) hypertension: Secondary | ICD-10-CM | POA: Insufficient documentation

## 2017-07-30 HISTORY — DX: Essential (primary) hypertension: I10

## 2017-07-30 LAB — CBG MONITORING, ED: GLUCOSE-CAPILLARY: 115 mg/dL — AB (ref 65–99)

## 2017-07-30 LAB — BASIC METABOLIC PANEL
Anion gap: 7 (ref 5–15)
BUN: 15 mg/dL (ref 6–20)
CHLORIDE: 104 mmol/L (ref 101–111)
CO2: 29 mmol/L (ref 22–32)
Calcium: 9.2 mg/dL (ref 8.9–10.3)
Creatinine, Ser: 1.39 mg/dL — ABNORMAL HIGH (ref 0.61–1.24)
GFR calc non Af Amer: 59 mL/min — ABNORMAL LOW (ref >60)
Glucose, Bld: 111 mg/dL — ABNORMAL HIGH (ref 65–99)
POTASSIUM: 4.2 mmol/L (ref 3.5–5.1)
Sodium: 140 mmol/L (ref 135–145)

## 2017-07-30 LAB — CBC WITH DIFFERENTIAL/PLATELET
Abs Immature Granulocytes: 0 10*3/uL (ref 0.0–0.1)
BASOS ABS: 0 10*3/uL (ref 0.0–0.1)
BASOS PCT: 1 %
EOS ABS: 0.1 10*3/uL (ref 0.0–0.7)
EOS PCT: 1 %
HCT: 43.6 % (ref 39.0–52.0)
Hemoglobin: 14.1 g/dL (ref 13.0–17.0)
Immature Granulocytes: 0 %
Lymphocytes Relative: 39 %
Lymphs Abs: 2.4 10*3/uL (ref 0.7–4.0)
MCH: 28.1 pg (ref 26.0–34.0)
MCHC: 32.3 g/dL (ref 30.0–36.0)
MCV: 86.9 fL (ref 78.0–100.0)
MONOS PCT: 7 %
Monocytes Absolute: 0.4 10*3/uL (ref 0.1–1.0)
NEUTROS ABS: 3.1 10*3/uL (ref 1.7–7.7)
Neutrophils Relative %: 52 %
PLATELETS: 294 10*3/uL (ref 150–400)
RBC: 5.02 MIL/uL (ref 4.22–5.81)
RDW: 13.2 % (ref 11.5–15.5)
WBC: 6 10*3/uL (ref 4.0–10.5)

## 2017-07-30 LAB — I-STAT TROPONIN, ED: TROPONIN I, POC: 0 ng/mL (ref 0.00–0.08)

## 2017-07-30 LAB — URINALYSIS, ROUTINE W REFLEX MICROSCOPIC
Bilirubin Urine: NEGATIVE
GLUCOSE, UA: NEGATIVE mg/dL
KETONES UR: NEGATIVE mg/dL
Leukocytes, UA: NEGATIVE
NITRITE: NEGATIVE
PROTEIN: 30 mg/dL — AB
Specific Gravity, Urine: 1.026 (ref 1.005–1.030)
pH: 5 (ref 5.0–8.0)

## 2017-07-30 MED ORDER — HYDRALAZINE HCL 20 MG/ML IJ SOLN
10.0000 mg | Freq: Once | INTRAMUSCULAR | Status: AC
  Administered 2017-07-30: 10 mg via INTRAVENOUS
  Filled 2017-07-30: qty 1

## 2017-07-30 MED ORDER — PROMETHAZINE HCL 25 MG/ML IJ SOLN
12.5000 mg | Freq: Once | INTRAMUSCULAR | Status: AC
  Administered 2017-07-30: 12.5 mg via INTRAVENOUS
  Filled 2017-07-30: qty 1

## 2017-07-30 NOTE — ED Notes (Addendum)
Patient transported to CT 

## 2017-07-30 NOTE — ED Provider Notes (Signed)
[facility] EMERGENCY DEPARTMENT Provider Note   CSN: [id number] Arrival date & time: 07/30/17  1914     History   Chief Complaint Chief Complaint  Patient presents with  . Loss of Consciousness    HPI Ricky Mann is a 47 y.o. male with past medical history of hypertension, presenting to the ED after syncopal episode that occurred prior to arrival.  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  Denies head trauma.  He states he did not lose consciousness for more than a minute.  He states he has had mild headache throughout the day without vision changes, photophobia, nausea.  States he did forget to take his blood pressure medications today.  He takes losartan and metoprolol.  Denies chest pain, palpitations, vision changes, abdominal pain, numbness or weakness in extremities, or any other complaints.  Patient states he does not feel lightheaded currently, though endorses mild headache.  The history is provided by the patient.    Past Medical History:  Diagnosis Date  . Hypertension     There are no active problems to display for this patient.   History reviewed. No pertinent surgical history.      Home Medications    Prior to Admission medications   Medication Sig Start Date End Date Taking? Authorizing Provider  losartan (COZAAR) 50 MG tablet Take 50 mg by mouth daily.   Yes [provider]  metoprolol succinate (TOPROL-XL) 25 MG 24 hr tablet Take 25 mg by mouth daily.   Yes [provider]  naproxen sodium (ANAPROX) 220 MG tablet Take 220 mg by mouth 2 (two) times daily as needed. For pain.   Yes [provider]    Family History No family history on file.  Social History Social History   Tobacco Use  . Smoking status: Never  Smoker  Substance Use Topics  . Alcohol use: No  . Drug use: No     Allergies   Patient has no known allergies.   Review of Systems Review of Systems  Constitutional: Negative for fever.  Eyes: Negative for photophobia and visual disturbance.  Respiratory: Negative for shortness of breath.   Cardiovascular: Positive for syncope. Negative for chest pain and palpitations.  Gastrointestinal: Negative for abdominal pain and nausea.  Musculoskeletal: Negative for back pain and neck pain.  Neurological: Positive for syncope, light-headedness and headaches. Negative for facial asymmetry, speech difficulty, weakness and numbness.  All other systems reviewed and are negative.    Physical Exam Updated Vital Signs BP (!) 179/116 (BP Location: Right Arm)   Pulse 92   Temp 98.7 F (37.1 C) (Oral)   Resp 20   Ht 5' 8"  (1.727 m)   Wt 124.7 kg (275 lb)   SpO2 98%   BMI 41.81 kg/m   Physical Exam  Constitutional: He is oriented to person, place, and time. He appears well-developed and well-nourished. No distress.  HENT:  Head: Normocephalic and atraumatic.  Eyes: Pupils are equal, round, and reactive to light. Conjunctivae and EOM are normal.  Neck: Normal range of motion. Neck supple.  Cardiovascular: Normal rate, regular rhythm, normal heart sounds and intact distal pulses.  Pulmonary/Chest: Effort normal and breath sounds normal. No stridor. No respiratory distress. He has no wheezes. He has no  rales.  Abdominal: Soft. Bowel sounds are normal. He exhibits no distension and no mass. There is no tenderness. There is no guarding.  Musculoskeletal:  No spinal or paraspinal tenderness.  Neurological: He is alert and oriented to person, place, and time.  Mental Status:  Alert, oriented, thought content appropriate, able to give a coherent history. Speech fluent without evidence of aphasia. Able to follow 2 step commands without difficulty.  Cranial Nerves:  II:  Peripheral visual  fields grossly normal, pupils equal, round, reactive to light III,IV, VI: ptosis not present, extra-ocular motions intact bilaterally  V,VII: smile symmetric, facial light touch sensation equal VIII: hearing grossly normal to voice  X: uvula elevates symmetrically  XI: bilateral shoulder shrug symmetric and strong XII: midline tongue extension without fassiculations Motor:  Normal tone. 5/5 in upper and lower extremities bilaterally including strong and equal grip strength and dorsiflexion/plantar flexion Sensory: Pinprick and light touch normal in all extremities.  Deep Tendon Reflexes: 2+ and symmetric in the biceps and patella Cerebellar: normal finger-to-nose with bilateral upper extremities Gait: normal gait and balance CV: distal pulses palpable throughout    Skin: Skin is warm.  Psychiatric: He has a normal mood and affect. His behavior is normal.  Nursing note and vitals reviewed.    ED Treatments / Results  Labs (all labs ordered are listed, but only abnormal results are displayed) Labs Reviewed  BASIC METABOLIC PANEL - Abnormal; Notable for the following components:      Result Value   Glucose, Bld 111 (*)    Creatinine, Ser 1.39 (*)    GFR calc non Af Amer 59 (*)    All other components within normal limits  URINALYSIS, ROUTINE W REFLEX MICROSCOPIC - Abnormal; Notable for the following components:   Hgb urine dipstick SMALL (*)    Protein, ur 30 (*)    Bacteria, UA RARE (*)    All other components within normal limits  CBG MONITORING, ED - Abnormal; Notable for the following components:   Glucose-Capillary 115 (*)    All other components within normal limits  CBC WITH DIFFERENTIAL/PLATELET  I-STAT TROPONIN, ED    EKG EKG Interpretation  Date/Time:  Thursday Jul 30 2017 19:50:01 EDT Ventricular Rate:  95 PR Interval:    QRS Duration: 97 QT Interval:  355 QTC Calculation: 447 R Axis:   22 Text Interpretation:  Sinus rhythm Consider right atrial  enlargement Borderline repolarization abnormality Borderline ST elevation, anterior leads improved baseline.  Confirmed by Wandra Arthurs [phone]) on 07/30/2017 7:59:28 PM   Radiology Ct Head Wo Contrast  Result Date: 07/30/2017 CLINICAL DATA:  Syncopal episode at work today. Loss of consciousness. EXAM: CT HEAD WITHOUT CONTRAST TECHNIQUE: Contiguous axial images were obtained from the base of the skull through the vertex without intravenous contrast. COMPARISON:  None. FINDINGS: Brain: No large vascular territory infarct,, intra-axial mass nor extra-axial fluid collections. A few nonspecific punctate hyperdensities are noted of the left basal ganglia possibly related to tiny perforating vessels or potentially evolving calcifications. No hydrocephalus. Midline fourth ventricle and basal cisterns. Vascular: No hyperdense vessel sign. Skull: Intact bony calvarium. Sinuses/Orbits: Clear paranasal sinuses and mastoids. Slight divergence of gaze. Other: Negative IMPRESSION: Slight divergence of gaze/exotropia. No acute CT abnormality is identified. Electronically Signed   By: Ashley Royalty M.D.   On: 07/30/2017 20:32    Procedures Procedures (including critical care time)  Medications Ordered in ED Medications  hydrALAZINE (APRESOLINE) injection 10 mg (10 mg Intravenous Given 07/30/17 2007)  promethazine (PHENERGAN) injection 12.5 mg (12.5 mg Intravenous Given 07/30/17 2012)     Initial Impression / Assessment and Plan / ED Course  I have reviewed the triage vital signs and the nursing notes.  Pertinent labs & imaging results that were available during my care of the patient were reviewed by me and considered in my medical decision making (see chart for details).     Pt presenting to the ED after syncopal episode, with prodrome of feeling lightheaded. Patient without arrhythmia or tachycardia while here in the department. Denies CP. Patient without history of congestive heart failure, normal hematocrit,  normal ECG, no shortness of breath. Pt is noted to be hypertensive on arrival, though reports he did not take his BP medications today. Pt endorsing mild headache. No vision changes. Normal neurologic exam. Labs, EKG, CT head ordered.  CBC wnl. Mild elevation in Cr. U/A without infection. CT head neg for bleed or acute infarct. EKG without acute changes. Normal orthostatics. BP treated in the ED with improvement. HA treated with improvement. Overall workup reassuring. Pt discussed with Dr. Darl Householder, who recommends pt safe for discharge w PCP follow up. Discussed PCP follow up in 2-3 for recheck Cr. Strict return precautions discussed.   Discussed results, findings, treatment and follow up. Patient advised of return precautions. Patient verbalized understanding and agreed with plan.  Final Clinical Impressions(s) / ED Diagnoses   Final diagnoses:  Syncope, unspecified syncope type  Hypertension, unspecified type  Bad headache    ED Discharge Orders    None       Tolbert Matheson, [place] N, PA-C 07/30/17 2118    Drenda Freeze, MD 07/30/17 [phone]

## 2017-07-30 NOTE — ED Triage Notes (Signed)
Pt BIB EMS from work ([employer]) for syncopal episode. 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 Endorses LOC, denies hitting head. States he has had 5/10 HA most of the day. Pt HTN PTA, 220/130, did not take BP meds this evening.

## 2017-07-30 NOTE — Discharge Instructions (Signed)
Continue taking your blood pressure medications as prescribed. Stay hydrated. Schedule an appointment with your primary care provider to follow-up on your visit today and to have your creatinine (kidney function) rechecked in about 2 to 3 days. Return to the ER if you spearing chest pain, another episode of passing out, severe headache, vision changes, or new or concerning symptoms.

## 2019-09-05 IMAGING — CT CT HEAD W/O CM
4 series · 16 of 47 positions shown, 18 images · non-contrast
Comparison: None.

CLINICAL DATA: Syncopal episode at work today. Loss of
consciousness.

EXAM:
CT HEAD WITHOUT CONTRAST
TECHNIQUE: Contiguous axial images were obtained from the base of the skull
through the vertex without intravenous contrast.

[Series 3: head wo · axial · 0.45mm/px · z∈[-90,+46]mm · 7 of 37 slices shown, 9 images]
[im 5/37  brain]
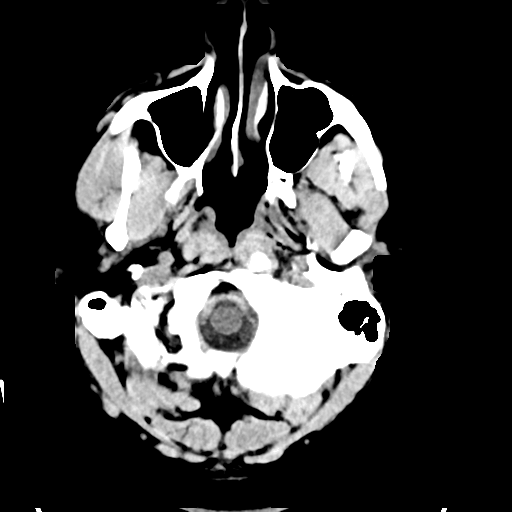
[im 5/37  bone]
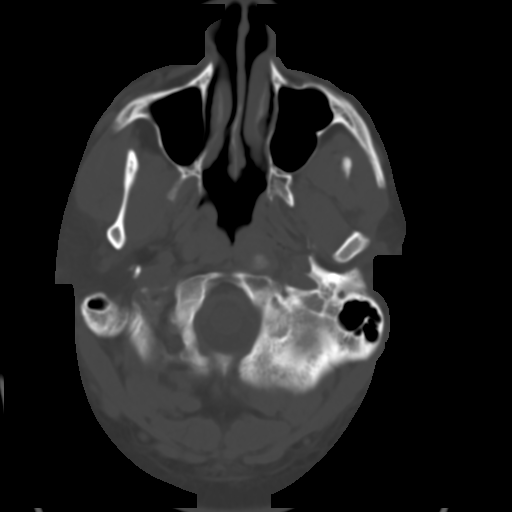
[im 10/37  brain]
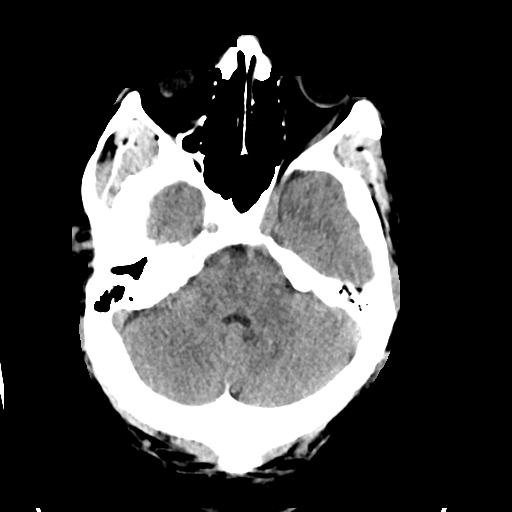
[im 14/37  brain]
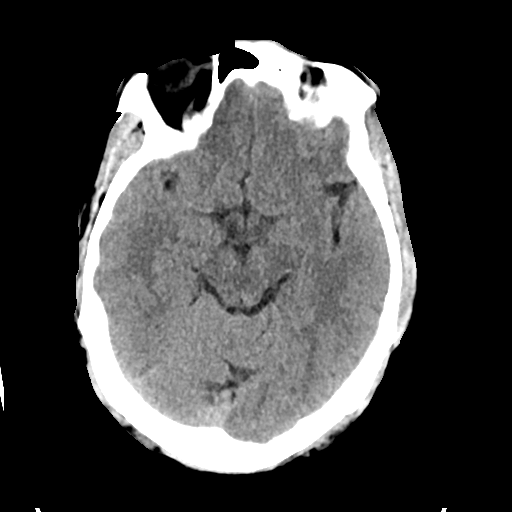
[im 19/37  brain]
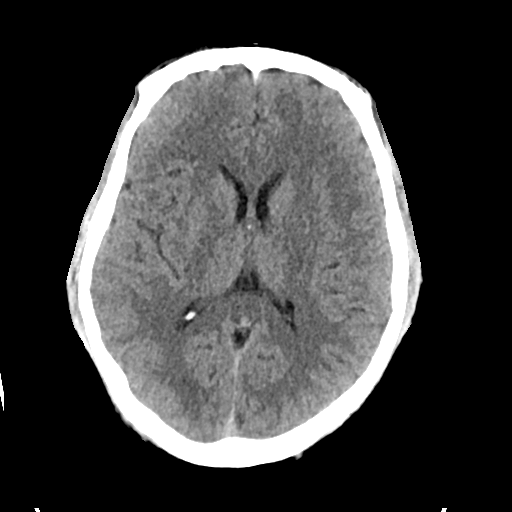
[im 23/37  brain]
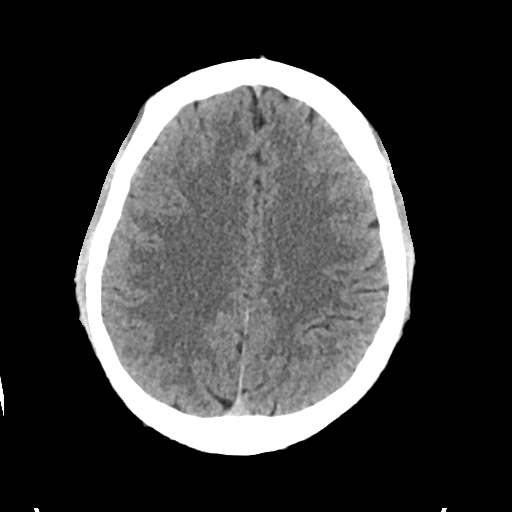
[im 23/37  bone]
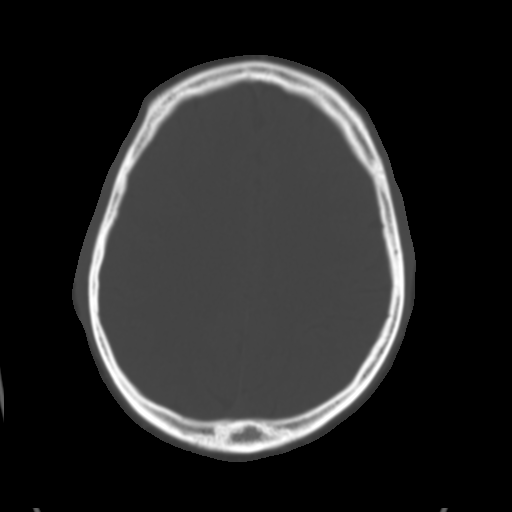
[im 28/37  brain]
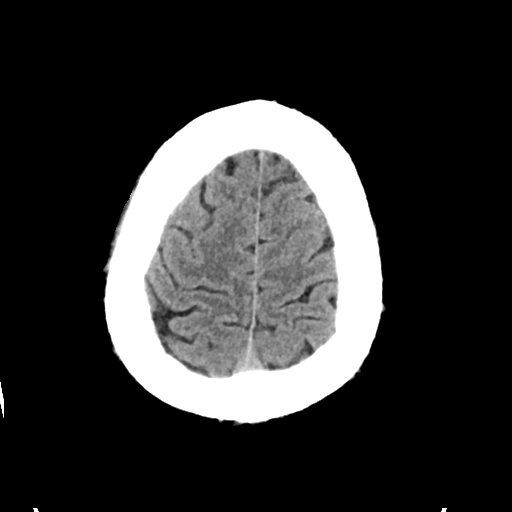
[im 32/37  brain]
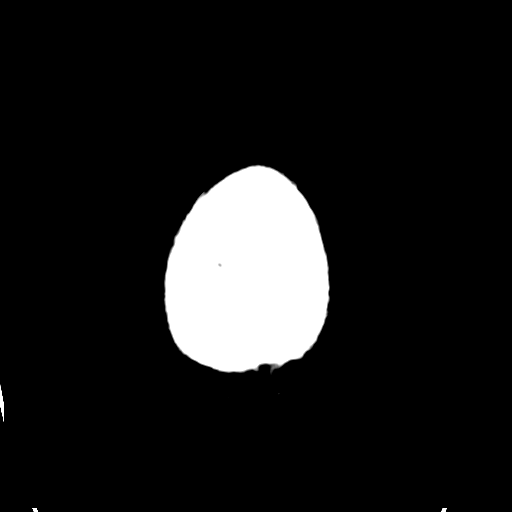

[Series 4: head bone · axial · 0.45mm/px · z∈[-92,-56]mm · 3 of 92 slices shown]
[im 10/92  bone]
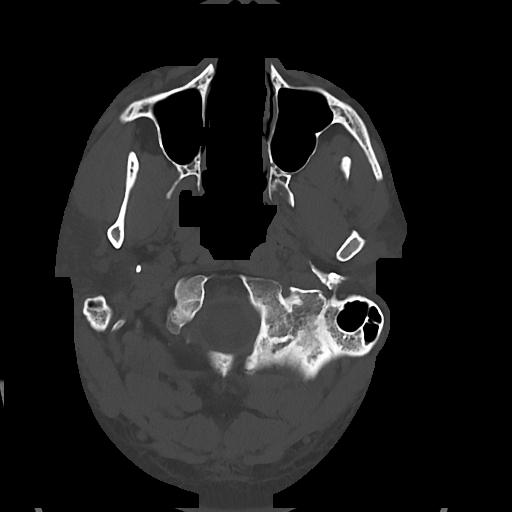
[im 19/92  bone]
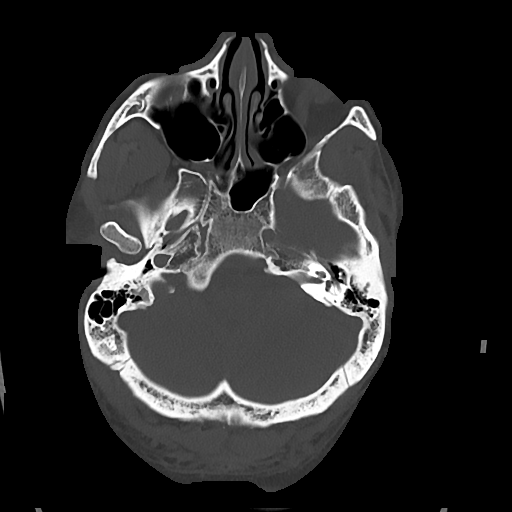
[im 28/92  bone]
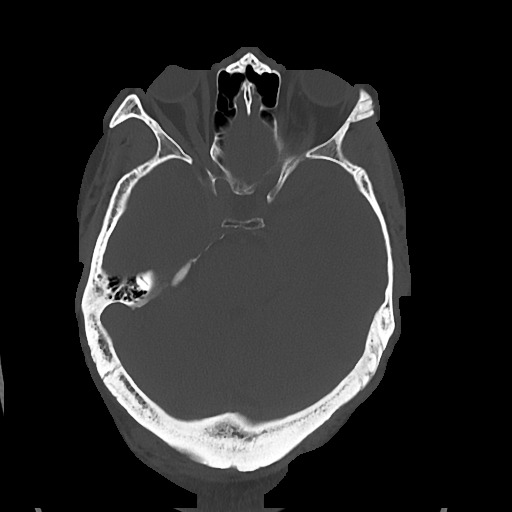

[Series 5: cor soft · coronal · 0.36mm/px · 3 of 74 slices shown]
[im 25/74  brain]
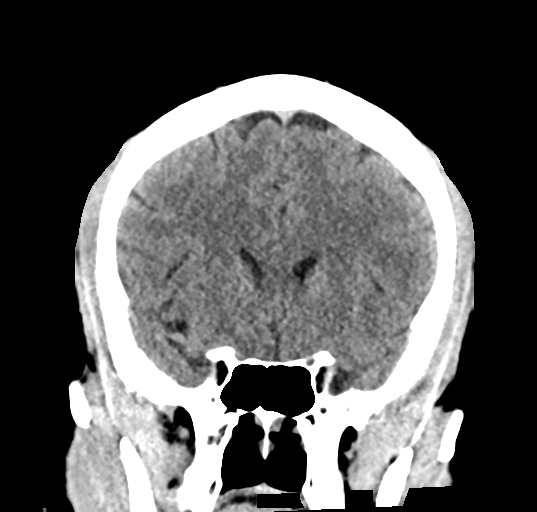
[im 33/74  brain]
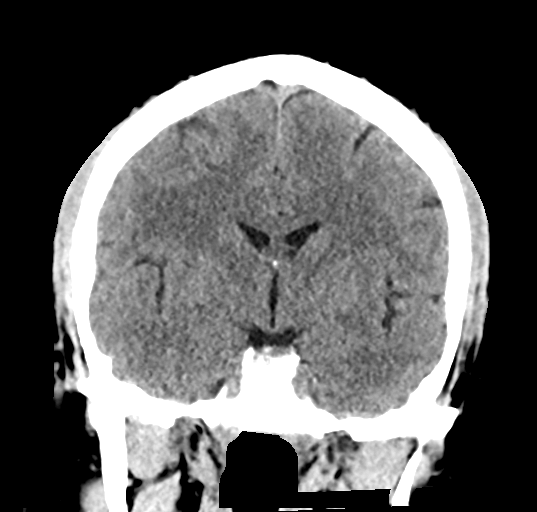
[im 41/74  brain]
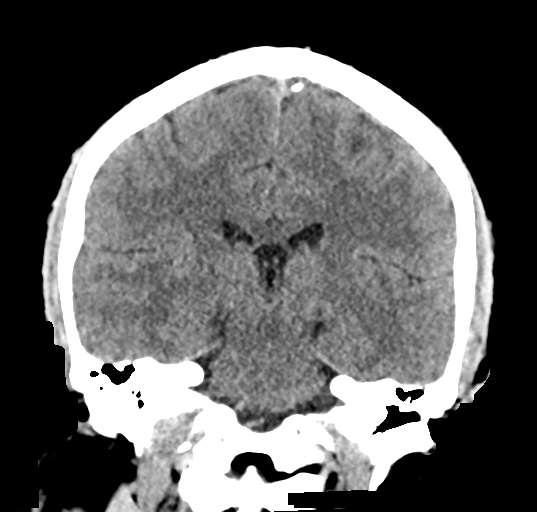

[Series 6: sag soft · sagittal · 0.36mm/px · 3 of 67 slices shown]
[im 23/67  brain]
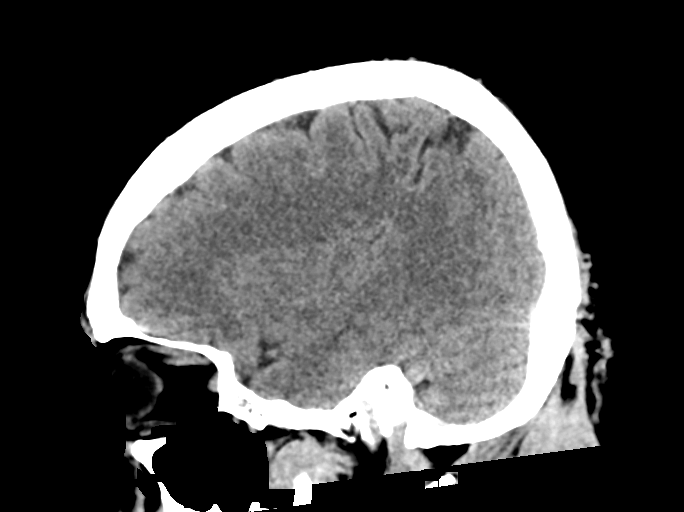
[im 34/67  brain]
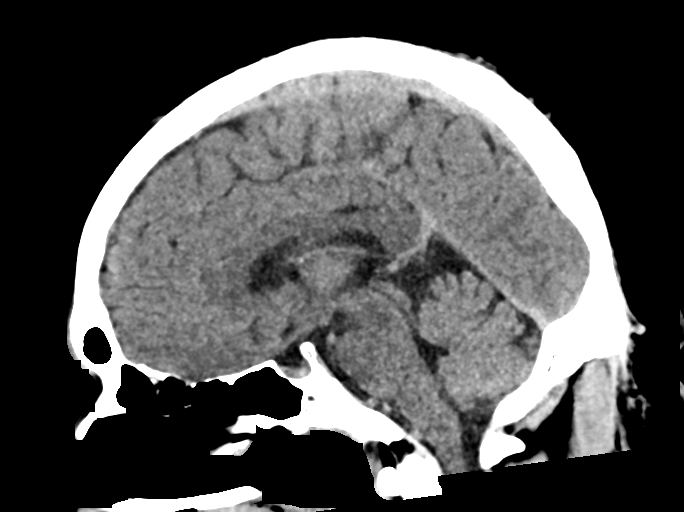
[im 45/67  brain]
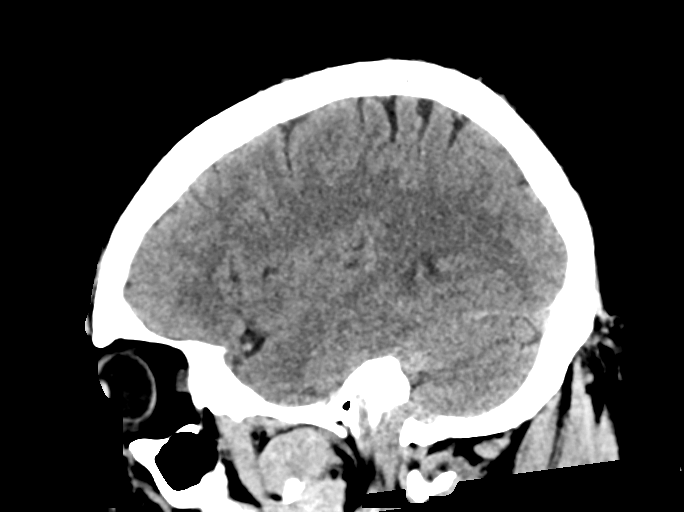

[16 of 47 positions shown; findings below may reference images not displayed]

FINDINGS: Brain: No large vascular territory infarct,, intra-axial mass nor
extra-axial fluid collections. A few nonspecific punctate
hyperdensities are noted of the left basal ganglia possibly related
to tiny perforating vessels or potentially evolving calcifications.
No hydrocephalus. Midline fourth ventricle and basal cisterns.

Vascular: No hyperdense vessel sign.

Skull: Intact bony calvarium.

Sinuses/Orbits: Clear paranasal sinuses and mastoids. Slight
divergence of gaze.

Other: Negative
IMPRESSION: Slight divergence of gaze/exotropia.

No acute CT abnormality is identified.
